# Patient Record
Sex: Female | Born: 1970 | Race: White | Hispanic: No | Smoking: Current every day smoker
Health system: Southern US, Community
[De-identification: ages and names within clinical notes are randomized; demographics above are authoritative.]

## PROBLEM LIST (undated history)

## (undated) HISTORY — PX: TUBAL LIGATION: SHX77

---

## 2000-02-15 ENCOUNTER — Other Ambulatory Visit: Admission: RE | Admit: 2000-02-15 | Discharge: 2000-02-15 | Payer: Self-pay | Admitting: Obstetrics and Gynecology

## 2003-04-06 ENCOUNTER — Other Ambulatory Visit: Admission: RE | Admit: 2003-04-06 | Discharge: 2003-04-06 | Payer: Self-pay | Admitting: Obstetrics and Gynecology

## 2003-04-07 ENCOUNTER — Encounter: Payer: Self-pay | Admitting: Obstetrics and Gynecology

## 2003-04-07 ENCOUNTER — Ambulatory Visit (HOSPITAL_COMMUNITY): Admission: RE | Admit: 2003-04-07 | Discharge: 2003-04-07 | Payer: Self-pay | Admitting: Obstetrics and Gynecology

## 2003-12-28 ENCOUNTER — Ambulatory Visit (HOSPITAL_COMMUNITY): Admission: RE | Admit: 2003-12-28 | Discharge: 2003-12-28 | Payer: Self-pay | Admitting: Family Medicine

## 2006-01-15 ENCOUNTER — Emergency Department (HOSPITAL_COMMUNITY): Admission: EM | Admit: 2006-01-15 | Discharge: 2006-01-16 | Payer: Self-pay | Admitting: Emergency Medicine

## 2008-04-30 ENCOUNTER — Emergency Department (HOSPITAL_COMMUNITY): Admission: EM | Admit: 2008-04-30 | Discharge: 2008-04-30 | Payer: Self-pay | Admitting: Emergency Medicine

## 2008-05-21 ENCOUNTER — Emergency Department (HOSPITAL_COMMUNITY): Admission: EM | Admit: 2008-05-21 | Discharge: 2008-05-21 | Payer: Self-pay | Admitting: Emergency Medicine

## 2009-12-23 ENCOUNTER — Emergency Department (HOSPITAL_COMMUNITY): Admission: EM | Admit: 2009-12-23 | Discharge: 2009-12-23 | Payer: Self-pay | Admitting: Emergency Medicine

## 2010-05-21 ENCOUNTER — Emergency Department (HOSPITAL_BASED_OUTPATIENT_CLINIC_OR_DEPARTMENT_OTHER): Admission: EM | Admit: 2010-05-21 | Discharge: 2010-05-21 | Payer: Self-pay | Admitting: Emergency Medicine

## 2010-11-01 LAB — URINALYSIS, ROUTINE W REFLEX MICROSCOPIC
Glucose, UA: NEGATIVE mg/dL
Ketones, ur: >80 mg/dL — AB
Nitrite: NEGATIVE
Protein, ur: NEGATIVE mg/dL
Urobilinogen, UA: 0.2 mg/dL (ref 0.0–1.0)
pH: 5.5 (ref 5.0–8.0)

## 2010-11-01 LAB — URINE CULTURE
Colony Count: >=100000
Culture  Setup Time: 201110031708

## 2010-11-01 LAB — STREP A DNA PROBE

## 2010-11-01 LAB — URINE MICROSCOPIC-ADD ON

## 2010-11-01 LAB — RAPID STREP SCREEN (MED CTR MEBANE ONLY): Streptococcus, Group A Screen (Direct): NEGATIVE

## 2011-05-22 LAB — POCT URINALYSIS DIP (DEVICE)
Nitrite: NEGATIVE
Protein, ur: 30 — AB
Urobilinogen, UA: 0.2
pH: 5.5

## 2011-05-22 LAB — POCT PREGNANCY, URINE: Preg Test, Ur: NEGATIVE

## 2011-12-24 ENCOUNTER — Emergency Department (HOSPITAL_BASED_OUTPATIENT_CLINIC_OR_DEPARTMENT_OTHER)
Admission: EM | Admit: 2011-12-24 | Discharge: 2011-12-24 | Disposition: A | Discharge status: Home / Self Care | Payer: Self-pay | Attending: Emergency Medicine | Admitting: Emergency Medicine

## 2011-12-24 ENCOUNTER — Encounter (HOSPITAL_BASED_OUTPATIENT_CLINIC_OR_DEPARTMENT_OTHER): Payer: Self-pay

## 2011-12-24 DIAGNOSIS — F17200 Nicotine dependence, unspecified, uncomplicated: Secondary | ICD-10-CM | POA: Insufficient documentation

## 2011-12-24 DIAGNOSIS — M545 Low back pain, unspecified: Secondary | ICD-10-CM | POA: Insufficient documentation

## 2011-12-24 DIAGNOSIS — M549 Dorsalgia, unspecified: Secondary | ICD-10-CM

## 2011-12-24 DIAGNOSIS — N390 Urinary tract infection, site not specified: Secondary | ICD-10-CM | POA: Insufficient documentation

## 2011-12-24 LAB — URINALYSIS, ROUTINE W REFLEX MICROSCOPIC
Bilirubin Urine: NEGATIVE
Glucose, UA: NEGATIVE mg/dL
Hgb urine dipstick: NEGATIVE
Ketones, ur: NEGATIVE mg/dL
Protein, ur: NEGATIVE mg/dL

## 2011-12-24 LAB — URINE MICROSCOPIC-ADD ON

## 2011-12-24 MED ORDER — OXYCODONE-ACETAMINOPHEN 5-325 MG PO TABS
2.0000 | ORAL_TABLET | Freq: Once | ORAL | Status: AC
  Administered 2011-12-24: 2 via ORAL
  Filled 2011-12-24: qty 2

## 2011-12-24 MED ORDER — CIPROFLOXACIN HCL 500 MG PO TABS: 500.0000 mg | ORAL_TABLET | Freq: Two times a day (BID) | ORAL | Status: AC

## 2011-12-24 MED ORDER — KETOROLAC TROMETHAMINE 60 MG/2ML IM SOLN
60.0000 mg | Freq: Once | INTRAMUSCULAR | Status: AC
  Administered 2011-12-24: 60 mg via INTRAMUSCULAR
  Filled 2011-12-24: qty 2

## 2011-12-24 MED ORDER — OXYCODONE-ACETAMINOPHEN 5-325 MG PO TABS: 1.0000 | ORAL_TABLET | Freq: Four times a day (QID) | ORAL | Status: AC | PRN

## 2011-12-24 NOTE — ED Notes (Signed)
Pt reports back pain that started yesterday.  Denies injury. 

## 2011-12-24 NOTE — Discharge Instructions (Signed)
Ibuprofen 600 mg three times daily for the next 5 days.  Percocet as prescribed for pain not relieved with ibuprofen.  Cipro 500 mg twice daily for the next 5 days.   Back Pain, Adult Low back pain is very common. About 1 in 5 people have back pain.The cause of low back pain is rarely dangerous. The pain often gets better over time.About half of people with a sudden onset of back pain feel better in just 2 weeks. About 8 in 10 people feel better by 6 weeks.  CAUSES Some common causes of back pain include:  Strain of the muscles or ligaments supporting the spine.   Wear and tear (degeneration) of the spinal discs.   Arthritis.   Direct injury to the back.  DIAGNOSIS Most of the time, the direct cause of low back pain is not known.However, back pain can be treated effectively even when the exact cause of the pain is unknown.Answering your caregiver's questions about your overall health and symptoms is one of the most accurate ways to make sure the cause of your pain is not dangerous. If your caregiver needs more information, he or she may order lab work or imaging tests (X-rays or MRIs).However, even if imaging tests show changes in your back, this usually does not require surgery. HOME CARE INSTRUCTIONS For many people, back pain returns.Since low back pain is rarely dangerous, it is often a condition that people can learn to [facility] their own.   Remain active. It is stressful on the back to sit or stand in one place. Do not sit, drive, or stand in one place for more than 30 minutes at a time. Take short walks on level surfaces as soon as pain allows.Try to increase the length of time you walk each day.   Do not stay in bed.Resting more than 1 or 2 days can delay your recovery.   Do not avoid exercise or work.Your body is made to move.It is not dangerous to be active, even though your back may hurt.Your back will likely heal faster if you return to being active before your  pain is gone.   Pay attention to your body when you bend and lift. Many people have less discomfortwhen lifting if they bend their knees, keep the load close to their bodies,and avoid twisting. Often, the most comfortable positions are those that put less stress on your recovering back.   Find a comfortable position to sleep. Use a firm mattress and lie on your side with your knees slightly bent. If you lie on your back, put a pillow under your knees.   Only take over-the-counter or prescription medicines as directed by your caregiver. Over-the-counter medicines to reduce pain and inflammation are often the most helpful.Your caregiver may prescribe muscle relaxant drugs.These medicines help dull your pain so you can more quickly return to your normal activities and healthy exercise.   Put ice on the injured area.   Put ice in a plastic bag.   Place a towel between your skin and the bag.   Leave the ice on for 15 to 20 minutes, 3 to 4 times a day for the first 2 to 3 days. After that, ice and heat may be alternated to reduce pain and spasms.   Ask your caregiver about trying back exercises and gentle massage. This may be of some benefit.   Avoid feeling anxious or stressed.Stress increases muscle tension and can worsen back pain.It is important to recognize when you are  anxious or stressed and learn ways to manage it.Exercise is a great option.  SEEK MEDICAL CARE IF:  You have pain that is not relieved with rest or medicine.   You have pain that does not improve in 1 week.   You have new symptoms.   You are generally not feeling well.  SEEK IMMEDIATE MEDICAL CARE IF:   You have pain that radiates from your back into your legs.   You develop new bowel or bladder control problems.   You have unusual weakness or numbness in your arms or legs.   You develop nausea or vomiting.   You develop abdominal pain.   You feel faint.  Document Released: 08/05/2005 Document Revised:  07/25/2011 Document Reviewed: 12/24/2010 [facility] Patient Information 2012 [place], [place].Urinary Tract Infection A urinary tract infection (UTI) is often caused by a germ (bacteria). A UTI is usually helped with medicine (antibiotics) that kills germs. Take all the medicine until it is gone. Do this even if you are feeling better. You are usually better in 7 to 10 days. HOME CARE   Drink enough water and fluids to keep your pee (urine) clear or pale yellow. Drink:   Cranberry juice.   Water.   Avoid:   Caffeine.   Tea.   Bubbly (carbonated) drinks.   Alcohol.   Only take medicine as told by your doctor.   To prevent further infections:   Pee often.   After pooping (bowel movement), women should wipe from front to back. Use each tissue only once.   Pee before and after having sex (intercourse).  Ask your doctor when your test results will be ready. Make sure you follow up and get your test results.  GET HELP RIGHT AWAY IF:   There is very bad back pain or lower belly (abdominal) pain.   You get the chills.   You have a fever.   Your baby is older than 3 months with a rectal temperature of 102 F (38.9 C) or higher.   Your baby is 67 months old or younger with a rectal temperature of 100.4 F (38 C) or higher.   You feel sick to your stomach (nauseous) or throw up (vomit).   There is continued burning with peeing.   Your problems are not better in 3 days. Return sooner if you are getting worse.  MAKE SURE YOU:   Understand these instructions.   Will watch your condition.   Will get help right away if you are not doing well or get worse.  Document Released: 01/22/2008 Document Revised: 07/25/2011 Document Reviewed: 01/22/2008 [facility] Patient Information 2012 [place], [place].

## 2011-12-24 NOTE — ED Provider Notes (Signed)
History     CSN: [id number]  Arrival date & time 12/24/11  1743   First MD Initiated Contact with Patient 12/24/11 1811      Chief Complaint  Patient presents with  . Back Pain    (Consider location/radiation/quality/duration/timing/severity/associated sxs/prior treatment) HPI Comments: Also complains of dysuria.  Patient is a 41 y.o. female presenting with back pain. The history is provided by the patient.  Back Pain  This is a new problem. The current episode started yesterday. The problem occurs constantly. The problem has been rapidly worsening. The pain is associated with no known injury. The pain is present in the lumbar spine. The quality of the pain is described as stabbing. The pain does not radiate. The pain is severe. The symptoms are aggravated by certain positions, twisting and bending. The pain is the same all the time. Pertinent negatives include no bowel incontinence, no bladder incontinence, no paresthesias, no tingling and no weakness. She has tried NSAIDs for the symptoms. The treatment provided no relief.    History reviewed. No pertinent past medical history.  Past Surgical History  Procedure Date  . Cesarean section     No family history on file.  History  Substance Use Topics  . Smoking status: Current Everyday Smoker -- 0.5 packs/day  . Smokeless tobacco: Not on file  . Alcohol Use: Yes     occasionally    OB History    Grav Para Term Preterm Abortions TAB SAB Ect Mult Living                  Review of Systems  Gastrointestinal: Negative for bowel incontinence.  Genitourinary: Negative for bladder incontinence.  Musculoskeletal: Positive for back pain.  Neurological: Negative for tingling, weakness and paresthesias.  All other systems reviewed and are negative.    Allergies  Review of patient's allergies indicates no known allergies.  Home Medications   Current Outpatient Rx  Name Route Sig Dispense Refill  . IBUPROFEN 200 MG PO TABS  Oral Take 800 mg by mouth every 6 (six) hours as needed. For pain      BP 99/67  Pulse 97  Temp(Src) 97.7 F (36.5 C) (Oral)  Resp 16  Ht 5\' 2"  (1.575 m)  Wt 112 lb (50.803 kg)  BMI 20.49 kg/m2  SpO2 98%  LMP 12/03/2011  Physical Exam  Nursing note and vitals reviewed. Constitutional: She is oriented to person, place, and time. She appears well-developed and well-nourished. No distress.  HENT:  Head: Normocephalic and atraumatic.  Neck: Normal range of motion. Neck supple.  Abdominal: Soft. Bowel sounds are normal. She exhibits no distension. There is no tenderness.  Musculoskeletal: Normal range of motion.       There is ttp in the soft tissues of the lumbar spine.  The DTR's are 2+ and equal in the ble and strength is 5/5 in the ble.  Ambulatory without difficulty.  Neurological: She is alert and oriented to person, place, and time. She has normal reflexes. Coordination normal.  Skin: Skin is warm and dry. She is not diaphoretic.    ED Course  Procedures (including critical care time)  Labs Reviewed  URINALYSIS, ROUTINE W REFLEX MICROSCOPIC - Abnormal; Notable for the following:    APPearance CLOUDY (*)    Nitrite POSITIVE (*)    All other components within normal limits  URINE MICROSCOPIC-ADD ON - Abnormal; Notable for the following:    Bacteria, UA MANY (*)    Crystals CA OXALATE CRYSTALS (*)  All other components within normal limits  PREGNANCY, URINE   No results found.   No diagnosis found.    MDM  The symptoms sound musculoskeletal in nature and the exam is consistent with this.  The urine shows a uti but I am certain the majority of her pain is musculoskeletal.        Geoffery Lyons, MD 12/24/11 1905

## 2012-11-01 ENCOUNTER — Encounter (HOSPITAL_COMMUNITY): Payer: Self-pay | Admitting: Emergency Medicine

## 2012-11-01 ENCOUNTER — Emergency Department (HOSPITAL_COMMUNITY)
Admission: EM | Admit: 2012-11-01 | Discharge: 2012-11-01 | Disposition: A | Discharge status: Home / Self Care | Payer: No Typology Code available for payment source | Attending: Emergency Medicine | Admitting: Emergency Medicine

## 2012-11-01 ENCOUNTER — Emergency Department (HOSPITAL_COMMUNITY): Payer: No Typology Code available for payment source

## 2012-11-01 DIAGNOSIS — Z3202 Encounter for pregnancy test, result negative: Secondary | ICD-10-CM | POA: Insufficient documentation

## 2012-11-01 DIAGNOSIS — S79919A Unspecified injury of unspecified hip, initial encounter: Secondary | ICD-10-CM | POA: Insufficient documentation

## 2012-11-01 DIAGNOSIS — S42009A Fracture of unspecified part of unspecified clavicle, initial encounter for closed fracture: Secondary | ICD-10-CM | POA: Insufficient documentation

## 2012-11-01 DIAGNOSIS — Y939 Activity, unspecified: Secondary | ICD-10-CM | POA: Insufficient documentation

## 2012-11-01 DIAGNOSIS — Y92410 Unspecified street and highway as the place of occurrence of the external cause: Secondary | ICD-10-CM | POA: Insufficient documentation

## 2012-11-01 DIAGNOSIS — F17200 Nicotine dependence, unspecified, uncomplicated: Secondary | ICD-10-CM | POA: Insufficient documentation

## 2012-11-01 DIAGNOSIS — R404 Transient alteration of awareness: Secondary | ICD-10-CM | POA: Insufficient documentation

## 2012-11-01 LAB — POCT I-STAT, CHEM 8
Creatinine, Ser: 0.8 mg/dL (ref 0.50–1.10)
HCT: 44 % (ref 36.0–46.0)
Hemoglobin: 15 g/dL (ref 12.0–15.0)
Potassium: 2.8 mEq/L — ABNORMAL LOW (ref 3.5–5.1)
Sodium: 144 mEq/L (ref 135–145)

## 2012-11-01 LAB — URINALYSIS, ROUTINE W REFLEX MICROSCOPIC
Nitrite: POSITIVE — AB
Specific Gravity, Urine: 1.018 (ref 1.005–1.030)
Urobilinogen, UA: 0.2 mg/dL (ref 0.0–1.0)

## 2012-11-01 LAB — CBC WITH DIFFERENTIAL/PLATELET
Basophils Absolute: 0 K/uL (ref 0.0–0.1)
Basophils Relative: 0 % (ref 0–1)
Eosinophils Absolute: 0 K/uL (ref 0.0–0.7)
Eosinophils Relative: 0 % (ref 0–5)
HCT: 40.5 % (ref 36.0–46.0)
Hemoglobin: 14.6 g/dL (ref 12.0–15.0)
Lymphocytes Relative: 6 % — ABNORMAL LOW (ref 12–46)
Lymphs Abs: 1.6 K/uL (ref 0.7–4.0)
MCH: 28.6 pg (ref 26.0–34.0)
MCHC: 36 g/dL (ref 30.0–36.0)
MCV: 79.4 fL (ref 78.0–100.0)
Monocytes Absolute: 1.8 K/uL — ABNORMAL HIGH (ref 0.1–1.0)
Monocytes Relative: 7 % (ref 3–12)
Neutro Abs: 22.6 K/uL — ABNORMAL HIGH (ref 1.7–7.7)
Neutrophils Relative %: 87 % — ABNORMAL HIGH (ref 43–77)
Platelets: 420 K/uL — ABNORMAL HIGH (ref 150–400)
RBC: 5.1 MIL/uL (ref 3.87–5.11)
RDW: 14.9 % (ref 11.5–15.5)
WBC: 26 K/uL — ABNORMAL HIGH (ref 4.0–10.5)

## 2012-11-01 LAB — URINE MICROSCOPIC-ADD ON

## 2012-11-01 LAB — HEPATIC FUNCTION PANEL
Alkaline Phosphatase: 50 U/L (ref 39–117)
Indirect Bilirubin: 0.2 mg/dL — ABNORMAL LOW (ref 0.3–0.9)
Total Protein: 6.8 g/dL (ref 6.0–8.3)

## 2012-11-01 LAB — POCT PREGNANCY, URINE: Preg Test, Ur: NEGATIVE

## 2012-11-01 LAB — ETHANOL: Alcohol, Ethyl (B): 162 mg/dL — ABNORMAL HIGH (ref 0–11)

## 2012-11-01 MED ORDER — POTASSIUM CHLORIDE CRYS ER 20 MEQ PO TBCR
40.0000 meq | EXTENDED_RELEASE_TABLET | Freq: Once | ORAL | Status: AC
  Administered 2012-11-01: 40 meq via ORAL
  Filled 2012-11-01 (×2): qty 2

## 2012-11-01 MED ORDER — SODIUM CHLORIDE 0.9 % IV SOLN
INTRAVENOUS | Status: DC
  Administered 2012-11-01: 12:00:00 via INTRAVENOUS

## 2012-11-01 MED ORDER — NAPROXEN 500 MG PO TABS: 500.0000 mg | ORAL_TABLET | Freq: Two times a day (BID) | ORAL | Status: DC

## 2012-11-01 MED ORDER — KETOROLAC TROMETHAMINE 30 MG/ML IJ SOLN
30.0000 mg | Freq: Once | INTRAMUSCULAR | Status: AC
  Administered 2012-11-01: 30 mg via INTRAVENOUS
  Filled 2012-11-01: qty 1

## 2012-11-01 MED ORDER — ONDANSETRON HCL 4 MG/2ML IJ SOLN
4.0000 mg | Freq: Once | INTRAMUSCULAR | Status: AC
  Administered 2012-11-01: 4 mg via INTRAVENOUS
  Filled 2012-11-01 (×2): qty 2

## 2012-11-01 MED ORDER — POTASSIUM CHLORIDE 10 MEQ/100ML IV SOLN
10.0000 meq | Freq: Once | INTRAVENOUS | Status: AC
  Administered 2012-11-01: 10 meq via INTRAVENOUS
  Filled 2012-11-01 (×2): qty 100

## 2012-11-01 MED ORDER — DIAZEPAM 5 MG PO TABS: 5.0000 mg | ORAL_TABLET | Freq: Four times a day (QID) | ORAL | Status: DC | PRN

## 2012-11-01 MED ORDER — HYDROMORPHONE HCL PF 1 MG/ML IJ SOLN
0.5000 mg | Freq: Once | INTRAMUSCULAR | Status: AC
  Administered 2012-11-01: 0.5 mg via INTRAVENOUS
  Filled 2012-11-01 (×2): qty 1

## 2012-11-01 MED ORDER — DIAZEPAM 5 MG/ML IJ SOLN
5.0000 mg | Freq: Once | INTRAMUSCULAR | Status: AC
  Administered 2012-11-01: 5 mg via INTRAVENOUS
  Filled 2012-11-01: qty 2

## 2012-11-01 MED ORDER — HYDROMORPHONE HCL PF 1 MG/ML IJ SOLN
0.5000 mg | Freq: Once | INTRAMUSCULAR | Status: AC
  Administered 2012-11-01: 0.5 mg via INTRAVENOUS
  Filled 2012-11-01: qty 1

## 2012-11-01 MED ORDER — IOHEXOL 300 MG/ML  SOLN
100.0000 mL | Freq: Once | INTRAMUSCULAR | Status: AC | PRN
  Administered 2012-11-01: 100 mL via INTRAVENOUS

## 2012-11-01 MED ORDER — PERCOCET 5-325 MG PO TABS: 1.0000 | ORAL_TABLET | Freq: Four times a day (QID) | ORAL | Status: DC | PRN

## 2012-11-01 MED ORDER — SODIUM CHLORIDE 0.9 % IV BOLUS (SEPSIS)
1000.0000 mL | Freq: Once | INTRAVENOUS | Status: AC
  Administered 2012-11-01: 1000 mL via INTRAVENOUS
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
## 2012-11-01 NOTE — Progress Notes (Signed)
Orthopedic Tech Progress Note Patient Details:  Rose Monroe 07-Aug-1971 [id number] Sling immobilizer ordered for patient. Patient did not tolerate application well. Patient complained about pain and asked that sling be removed stating that she could not have her arm bent in sling because it was too painful. Sling removed, nurse notified. Nurse stated that he was comfortable with applying immobilizer once patient got more pain medication.  Ortho Devices Type of Ortho Device: Sling immobilizer Ortho Device/Splint Location: Right Ortho Device/Splint Interventions: Application   Asia R Thompson 11/01/2012, 11:14 AM
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
## 2012-11-01 NOTE — ED Notes (Signed)
Unable to obtain oral temperature at this time. Pt crying and thermometer will not read.

## 2012-11-12 ENCOUNTER — Encounter (HOSPITAL_BASED_OUTPATIENT_CLINIC_OR_DEPARTMENT_OTHER): Payer: Self-pay | Admitting: Family Medicine

## 2012-11-12 ENCOUNTER — Emergency Department (HOSPITAL_BASED_OUTPATIENT_CLINIC_OR_DEPARTMENT_OTHER)
Admission: EM | Admit: 2012-11-12 | Discharge: 2012-11-12 | Disposition: A | Discharge status: Home / Self Care | Payer: Medicaid Other | Attending: Emergency Medicine | Admitting: Emergency Medicine

## 2012-11-12 DIAGNOSIS — G8911 Acute pain due to trauma: Secondary | ICD-10-CM | POA: Insufficient documentation

## 2012-11-12 DIAGNOSIS — Z79899 Other long term (current) drug therapy: Secondary | ICD-10-CM | POA: Insufficient documentation

## 2012-11-12 DIAGNOSIS — F17200 Nicotine dependence, unspecified, uncomplicated: Secondary | ICD-10-CM | POA: Insufficient documentation

## 2012-11-12 DIAGNOSIS — IMO0001 Reserved for inherently not codable concepts without codable children: Secondary | ICD-10-CM | POA: Insufficient documentation

## 2012-11-12 DIAGNOSIS — S42001D Fracture of unspecified part of right clavicle, subsequent encounter for fracture with routine healing: Secondary | ICD-10-CM

## 2012-11-12 MED ORDER — OXYCODONE-ACETAMINOPHEN 5-325 MG PO TABS: 2.0000 | ORAL_TABLET | ORAL | Status: DC | PRN

## 2012-11-12 NOTE — ED Provider Notes (Signed)
Medical screening examination/treatment/procedure(s) were performed by non-physician practitioner and as supervising physician I was immediately available for consultation/collaboration.   Charles B. Bernette Mayers, MD 11/12/12 1515
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
## 2012-11-16 NOTE — ED Provider Notes (Signed)
Medical screening examination/treatment/procedure(s) were performed by non-physician practitioner and as supervising physician I was immediately available for consultation/collaboration.  Demetrus Pavao, MD 11/16/12 0724 

## 2012-11-26 ENCOUNTER — Encounter (HOSPITAL_COMMUNITY): Payer: Self-pay | Admitting: *Deleted

## 2012-11-26 ENCOUNTER — Emergency Department (INDEPENDENT_AMBULATORY_CARE_PROVIDER_SITE_OTHER)
Admission: EM | Admit: 2012-11-26 | Discharge: 2012-11-26 | Disposition: A | Discharge status: Home / Self Care | Payer: Self-pay | Source: Home / Self Care

## 2012-11-26 DIAGNOSIS — IMO0002 Reserved for concepts with insufficient information to code with codable children: Secondary | ICD-10-CM

## 2012-11-26 DIAGNOSIS — S42309S Unspecified fracture of shaft of humerus, unspecified arm, sequela: Secondary | ICD-10-CM

## 2012-11-26 DIAGNOSIS — S20212S Contusion of left front wall of thorax, sequela: Secondary | ICD-10-CM

## 2012-11-26 DIAGNOSIS — S42001S Fracture of unspecified part of right clavicle, sequela: Secondary | ICD-10-CM

## 2012-11-26 MED ORDER — OXYCODONE-ACETAMINOPHEN 5-325 MG PO TABS: ORAL_TABLET | ORAL | Status: DC
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
## 2012-11-26 NOTE — ED Provider Notes (Signed)
Medical screening examination/treatment/procedure(s) were performed by non-physician practitioner and as supervising physician I was immediately available for consultation/collaboration.  Lourie Retz, M.D.  Miciah Covelli C Lennis Korb, MD 11/26/12 2220 
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
## 2013-11-26 ENCOUNTER — Encounter (HOSPITAL_BASED_OUTPATIENT_CLINIC_OR_DEPARTMENT_OTHER): Payer: Self-pay | Admitting: Emergency Medicine

## 2013-11-26 ENCOUNTER — Emergency Department (HOSPITAL_BASED_OUTPATIENT_CLINIC_OR_DEPARTMENT_OTHER)
Admission: EM | Admit: 2013-11-26 | Discharge: 2013-11-26 | Disposition: A | Discharge status: Home / Self Care | Payer: Medicaid Other | Attending: Emergency Medicine | Admitting: Emergency Medicine

## 2013-11-26 DIAGNOSIS — S199XXA Unspecified injury of neck, initial encounter: Secondary | ICD-10-CM

## 2013-11-26 DIAGNOSIS — F17200 Nicotine dependence, unspecified, uncomplicated: Secondary | ICD-10-CM | POA: Insufficient documentation

## 2013-11-26 DIAGNOSIS — S20229A Contusion of unspecified back wall of thorax, initial encounter: Secondary | ICD-10-CM | POA: Insufficient documentation

## 2013-11-26 DIAGNOSIS — S0993XA Unspecified injury of face, initial encounter: Secondary | ICD-10-CM | POA: Insufficient documentation

## 2013-11-26 MED ORDER — NAPROXEN 500 MG PO TABS: 500.0000 mg | ORAL_TABLET | Freq: Two times a day (BID) | ORAL | Status: DC

## 2013-11-26 MED ORDER — OXYCODONE-ACETAMINOPHEN 5-325 MG PO TABS: ORAL_TABLET | ORAL | Status: DC
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
## 2013-11-26 NOTE — Discharge Instructions (Signed)
Contusion °A contusion is a deep bruise. Contusions happen when an injury causes bleeding under the skin. Signs of bruising include pain, puffiness (swelling), and discolored skin. The contusion may turn blue, purple, or yellow. °HOME CARE  °· Put ice on the injured area. °· Put ice in a plastic bag. °· Place a towel between your skin and the bag. °· Leave the ice on for 15-20 minutes, 03-04 times a day. °· Only take medicine as told by your doctor. °· Rest the injured area. °· If possible, raise (elevate) the injured area to lessen puffiness. °GET HELP RIGHT AWAY IF:  °· You have more bruising or puffiness. °· You have pain that is getting worse. °· Your puffiness or pain is not helped by medicine. °MAKE SURE YOU:  °· Understand these instructions. °· Will watch your condition. °· Will get help right away if you are not doing well or get worse. °Document Released: 01/22/2008 Document Revised: 10/28/2011 Document Reviewed: 06/10/2011 °[employer]® Patient Information ©2014 [employer]. ° °Cryotherapy °Cryotherapy means treatment with cold. Ice or gel packs can be used to reduce both pain and swelling. Ice is the most helpful within the first 24 to 48 hours after an injury or flareup from overusing a muscle or joint. Sprains, strains, spasms, burning pain, shooting pain, and aches can all be eased with ice. Ice can also be used when recovering from surgery. Ice is effective, has very few side effects, and is safe for most people to use. °PRECAUTIONS  °Ice is not a safe treatment option for people with: °· Raynaud's phenomenon. This is a condition affecting small blood vessels in the extremities. Exposure to cold may cause your problems to return. °· Cold hypersensitivity. There are many forms of cold hypersensitivity, including: °· Cold urticaria. Red, itchy hives appear on the skin when the tissues begin to warm after being iced. °· Cold erythema. This is a red, itchy rash caused by exposure to cold. °· Cold  hemoglobinuria. Red blood cells break down when the tissues begin to warm after being iced. The hemoglobin that carry oxygen are passed into the urine because they cannot combine with blood proteins fast enough. °· Numbness or altered sensitivity in the area being iced. °If you have any of the following conditions, do not use ice until you have discussed cryotherapy with your caregiver: °· Heart conditions, such as arrhythmia, angina, or chronic heart disease. °· High blood pressure. °· Healing wounds or open skin in the area being iced. °· Current infections. °· Rheumatoid arthritis. °· Poor circulation. °· Diabetes. °Ice slows the blood flow in the region it is applied. This is beneficial when trying to stop inflamed tissues from spreading irritating chemicals to surrounding tissues. However, if you expose your skin to cold temperatures for too long or without the proper protection, you can damage your skin or nerves. Watch for signs of skin damage due to cold. °HOME CARE INSTRUCTIONS °Follow these tips to use ice and cold packs safely. °· Place a dry or damp towel between the ice and skin. A damp towel will cool the skin more quickly, so you may need to shorten the time that the ice is used. °· For a more rapid response, add gentle compression to the ice. °· Ice for no more than 10 to 20 minutes at a time. The bonier the area you are icing, the less time it will take to get the benefits of ice. °· Check your skin after 5 minutes to make sure   there are no signs of a poor response to cold or skin damage. °· Rest 20 minutes or more in between uses. °· Once your skin is numb, you can end your treatment. You can test numbness by very lightly touching your skin. The touch should be so light that you do not see the skin dimple from the pressure of your fingertip. When using ice, most people will feel these normal sensations in this order: cold, burning, aching, and numbness. °· Do not use ice on someone who cannot  communicate their responses to pain, such as small children or people with dementia. °HOW TO MAKE AN ICE PACK °Ice packs are the most common way to use ice therapy. Other methods include ice massage, ice baths, and cryo-sprays. Muscle creams that cause a cold, tingly feeling do not offer the same benefits that ice offers and should not be used as a substitute unless recommended by your caregiver. °To make an ice pack, do one of the following: °· Place crushed ice or a bag of frozen vegetables in a sealable plastic bag. Squeeze out the excess air. Place this bag inside another plastic bag. Slide the bag into a pillowcase or place a damp towel between your skin and the bag. °· Mix 3 parts water with 1 part rubbing alcohol. Freeze the mixture in a sealable plastic bag. When you remove the mixture from the freezer, it will be slushy. Squeeze out the excess air. Place this bag inside another plastic bag. Slide the bag into a pillowcase or place a damp towel between your skin and the bag. °SEEK MEDICAL CARE IF: °· You develop white spots on your skin. This may give the skin a blotchy (mottled) appearance. °· Your skin turns blue or pale. °· Your skin becomes waxy or hard. °· Your swelling gets worse. °MAKE SURE YOU:  °· Understand these instructions. °· Will watch your condition. °· Will get help right away if you are not doing well or get worse. °Document Released: 04/01/2011 Document Revised: 10/28/2011 Document Reviewed: 04/01/2011 °ExitCare® Patient Information ©2014 [employer]. ° °
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
## 2013-12-10 NOTE — ED Provider Notes (Signed)
Medical screening examination/treatment/procedure(s) were performed by non-physician practitioner and as supervising physician I was immediately available for consultation/collaboration.   EKG Interpretation None        Charles B. Bernette MayersSheldon, MD 12/10/13 2320
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
## 2014-06-02 ENCOUNTER — Emergency Department (HOSPITAL_BASED_OUTPATIENT_CLINIC_OR_DEPARTMENT_OTHER)
Admission: EM | Admit: 2014-06-02 | Discharge: 2014-06-02 | Disposition: A | Discharge status: Home / Self Care | Payer: Medicaid Other | Attending: Emergency Medicine | Admitting: Emergency Medicine

## 2014-06-02 ENCOUNTER — Encounter (HOSPITAL_BASED_OUTPATIENT_CLINIC_OR_DEPARTMENT_OTHER): Payer: Self-pay | Admitting: Emergency Medicine

## 2014-06-02 DIAGNOSIS — L731 Pseudofolliculitis barbae: Secondary | ICD-10-CM | POA: Diagnosis not present

## 2014-06-02 DIAGNOSIS — Z720 Tobacco use: Secondary | ICD-10-CM | POA: Diagnosis not present

## 2014-06-02 DIAGNOSIS — N764 Abscess of vulva: Secondary | ICD-10-CM | POA: Diagnosis present

## 2014-06-02 MED ORDER — CEPHALEXIN 500 MG PO CAPS: 500.0000 mg | ORAL_CAPSULE | Freq: Four times a day (QID) | ORAL | Status: DC

## 2014-06-02 MED ORDER — HYDROCODONE-ACETAMINOPHEN 5-325 MG PO TABS: 1.0000 | ORAL_TABLET | Freq: Four times a day (QID) | ORAL | Status: DC | PRN

## 2014-06-02 MED ORDER — HYDROCODONE-ACETAMINOPHEN 5-325 MG PO TABS
2.0000 | ORAL_TABLET | Freq: Once | ORAL | Status: AC
  Administered 2014-06-02: 2 via ORAL
  Filled 2014-06-02: qty 2

## 2014-06-02 NOTE — ED Notes (Signed)
Pt states she has boils to her vaginal area for two days.  No fever, no vaginal discharge.

## 2014-06-02 NOTE — ED Notes (Signed)
Pelvic cart at bedside. 

## 2014-06-02 NOTE — Discharge Instructions (Signed)

## 2014-06-02 NOTE — ED Provider Notes (Signed)
CSN: [id number]     Arrival date & time 06/02/14  1319 History   First MD Initiated Contact with Patient 06/02/14 1324     Chief Complaint  Patient presents with  . Abscess     (Consider location/radiation/quality/duration/timing/severity/associated sxs/prior Treatment) Patient is a 43 y.o. female presenting with abscess. The history is provided by the patient.  Abscess Location:  Ano-genital Ano-genital abscess location:  Vulva Size:  1 cm Abscess quality: painful and redness   Abscess quality: not draining, no induration, no warmth and not weeping   Red streaking: no   Duration:  2 days Progression:  Unchanged Pain details:    Quality:  Sharp   Severity:  Moderate   Timing:  Constant   Progression:  Unchanged Chronicity:  New Context: skin injury (recently shaved)   Context: not insect bite/sting   Associated symptoms: no fever     No past medical history on file. Past Surgical History  Procedure Laterality Date  . Cesarean section    . Tubal ligation     No family history on file. History  Substance Use Topics  . Smoking status: Current Every Day Smoker -- 0.50 packs/day    Types: Cigarettes  . Smokeless tobacco: Not on file  . Alcohol Use: Yes     Comment: occasionally   OB History   Grav Para Term Preterm Abortions TAB SAB Ect Mult Living                 Review of Systems  Constitutional: Negative for fever and chills.  Respiratory: Negative for shortness of breath.   Cardiovascular: Negative for chest pain and leg swelling.  All other systems reviewed and are negative.     Allergies  Review of patient's allergies indicates no known allergies.  Home Medications   Prior to Admission medications   Medication Sig Start Date End Date Taking? Authorizing Provider  cephALEXin (KEFLEX) 500 MG capsule Take 1 capsule (500 mg total) by mouth 4 (four) times daily. 06/02/14   Elwin MochaBlair Haide Klinker, MD  HYDROcodone-acetaminophen (NORCO/VICODIN) 5-325 MG per tablet  Take 1 tablet by mouth every 6 (six) hours as needed for moderate pain. 06/02/14   Elwin MochaBlair Byron Peacock, MD   BP 126/81  Pulse 87  Temp(Src) 98.6 F (37 C) (Oral)  Resp 12  Ht 5\' 2"  (1.575 m)  Wt 107 lb (48.535 kg)  BMI 19.57 kg/m2  SpO2 100%  LMP 05/23/2014 Physical Exam  Nursing note and vitals reviewed. Constitutional: She is oriented to person, place, and time. She appears well-developed and well-nourished. No distress.  HENT:  Head: Normocephalic and atraumatic.  Mouth/Throat: Oropharynx is clear and moist.  Eyes: EOM are normal. Pupils are equal, round, and reactive to light.  Neck: Normal range of motion. Neck supple.  Cardiovascular: Normal rate and regular rhythm.  Exam reveals no friction rub.   No murmur heard. Pulmonary/Chest: Effort normal and breath sounds normal. No respiratory distress. She has no wheezes. She has no rales.  Abdominal: Soft. She exhibits no distension. There is no tenderness. There is no rebound.  Genitourinary:     No ulcerations, no blisters No signs of cellulitis, abscess.  Musculoskeletal: Normal range of motion. She exhibits no edema.  Neurological: She is alert and oriented to person, place, and time.  Skin: She is not diaphoretic.    ED Course  Procedures (including critical care time) Labs Review Labs Reviewed - No data to display  Imaging Review No results found.   EKG Interpretation  None      MDM   Final diagnoses:  Ingrown hair    5F here with folliculitis on her labia. Recently shaved and had one new sexual partner. No fevers. Pain severe. No vaginal d/c or bleeding. On exam, hard indurated lesions, no ulcerations, no blisters. No c/w chancre of syphilis or genital herpes. Instructed on warm compresses and given keflex. Stable for discharge.     Elwin MochaBlair Amirrah Quigley, MD 06/02/14 1352

## 2022-10-11 ENCOUNTER — Inpatient Hospital Stay (HOSPITAL_COMMUNITY)
Admission: AD | Admit: 2022-10-11 | Discharge: 2022-10-11 | Disposition: A | Discharge status: Home / Self Care | Payer: Managed Care, Other (non HMO) | Attending: Obstetrics & Gynecology | Admitting: Obstetrics & Gynecology

## 2022-10-11 ENCOUNTER — Encounter (HOSPITAL_COMMUNITY): Payer: Self-pay | Admitting: Obstetrics & Gynecology

## 2022-10-11 DIAGNOSIS — N939 Abnormal uterine and vaginal bleeding, unspecified: Secondary | ICD-10-CM | POA: Diagnosis not present

## 2022-10-11 LAB — POCT PREGNANCY, URINE: Preg Test, Ur: NEGATIVE

## 2022-10-11 MED ORDER — MEGESTROL ACETATE 40 MG PO TABS: 40.0000 mg | ORAL_TABLET | Freq: Two times a day (BID) | ORAL | 0 refills | Status: AC

## 2022-10-11 MED ORDER — IBUPROFEN 600 MG PO TABS: 600.0000 mg | ORAL_TABLET | Freq: Four times a day (QID) | ORAL | 0 refills | Status: AC | PRN

## 2022-10-11 NOTE — MAU Provider Note (Signed)
Event Date/Time   First Provider Initiated Contact with Patient 10/11/22 2103      [name] is a 52 y.o. non-pregnant patient who presents to MAU today with complaint of LMP of 09/28/2022 with heavy flow since start of period this month. She reports "since being here I have not had to change by tampon and pad. She reports she has been soaking through to her clothes while wearing a super plus tampon and pad at the same time. She has mild lower abdominal cramping; rated 3/10. She states her care has been managed by her Family Medicine MD in [facility] but she has not had a pap in 8 years. She was a past GYN patient of Dr. Willis Modena (last saw him 8 years ago). She reports she could get in to see him next week. She has not taken a HPT. She is afraid that she could either be pregnant or in menopause. She reports having "temporary clamps put on tubes."  O BP (!) 127/90 (BP Location: Right Arm)   Pulse (!) 102   Temp 98.4 F (36.9 C) (Oral)   Resp 16   Ht '5\' 2"'$  (1.575 m)   Wt 52.6 kg   LMP 09/28/2022   BMI 21.20 kg/m   Physical Exam Vitals and nursing note reviewed.  Constitutional:      Appearance: Normal appearance. She is normal weight.  Cardiovascular:     Rate and Rhythm: Tachycardia present.  Pulmonary:     Effort: Pulmonary effort is normal.  Genitourinary:    Comments: deferred Musculoskeletal:        General: Normal range of motion.  Skin:    General: Skin is warm and dry.  Neurological:     Mental Status: She is alert and oriented to person, place, and time.  Psychiatric:        Mood and Affect: Mood normal.        Behavior: Behavior normal.        Thought Content: Thought content normal.        Judgment: Judgment normal.     A Medical screening exam complete 1. Abnormal uterine bleeding (AUB)   P Offered CBC -- patient declined Discharge from MAU in stable condition Patient given the option Rx Megace 40 mg BID until seen by GYN Advised to get scheduled  with Dr. Willis Modena ASAP Warning signs for worsening condition that would warrant emergency follow-up discussed Patient may return to MAU as needed for pregnancy related concerns or heavy vaginal bleeding  Laury Deep, CNM 10/11/2022 9:04 PM

## 2022-10-11 NOTE — MAU Note (Signed)
Has passed clots- grape size

## 2022-10-11 NOTE — MAU Note (Addendum)
Pt says her cycles are usually reg.   This LMP started 09-28-2022-  says has bled heavy everyday. GYN- Dr Willis Modena- last seen 8 years- last Pap smear.  Was seen at [facility]  Dr in [place]-  No HPT- Pampon in . Pad on- no bleeding . No BC In 1998- had a temp clamp put on tubes  Cramping-3/10
